# Patient Record
Sex: Female | Born: 1935 | Race: White | Hispanic: No | Smoking: Former smoker
Health system: Southern US, Community
[De-identification: ages and names within clinical notes are randomized; demographics above are authoritative.]

## PROBLEM LIST (undated history)

## (undated) DIAGNOSIS — I2589 Other forms of chronic ischemic heart disease: Secondary | ICD-10-CM

## (undated) DIAGNOSIS — N189 Chronic kidney disease, unspecified: Secondary | ICD-10-CM

## (undated) DIAGNOSIS — N2581 Secondary hyperparathyroidism of renal origin: Secondary | ICD-10-CM

## (undated) DIAGNOSIS — E039 Hypothyroidism, unspecified: Secondary | ICD-10-CM

## (undated) DIAGNOSIS — T8859XA Other complications of anesthesia, initial encounter: Secondary | ICD-10-CM

## (undated) DIAGNOSIS — R112 Nausea with vomiting, unspecified: Secondary | ICD-10-CM

## (undated) DIAGNOSIS — I2585 Chronic coronary microvascular dysfunction: Secondary | ICD-10-CM

## (undated) DIAGNOSIS — Z9889 Other specified postprocedural states: Secondary | ICD-10-CM

## (undated) HISTORY — PX: CARDIAC CATHETERIZATION: SHX172

## (undated) HISTORY — PX: ABDOMINAL HYSTERECTOMY: SHX81

## (undated) HISTORY — PX: CHOLECYSTECTOMY: SHX55

## (undated) HISTORY — PX: TONSILLECTOMY: SUR1361

---

## 1997-09-22 HISTORY — PX: EYE SURGERY: SHX253

## 2007-08-03 ENCOUNTER — Encounter: Admission: RE | Admit: 2007-08-03 | Discharge: 2007-08-03 | Payer: Self-pay | Admitting: Otolaryngology

## 2007-08-04 ENCOUNTER — Encounter (INDEPENDENT_AMBULATORY_CARE_PROVIDER_SITE_OTHER): Payer: Self-pay | Admitting: Otolaryngology

## 2007-08-04 ENCOUNTER — Ambulatory Visit (HOSPITAL_BASED_OUTPATIENT_CLINIC_OR_DEPARTMENT_OTHER): Admission: RE | Admit: 2007-08-04 | Discharge: 2007-08-05 | Payer: Self-pay | Admitting: Otolaryngology

## 2011-02-04 NOTE — Op Note (Signed)
Hannah Moody, Hannah Moody                  ACCOUNT NO.:  [id number]   MEDICAL RECORD NO.:  [id number]          PATIENT TYPE:  AMB   LOCATION:  DSC                          FACILITY:  [facility]   PHYSICIAN:  Carolan Shiver, M.D.    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  I performed a  successful stapedectomy of her right ear on January 08, 1995.  She did  well and now has 20 dB hearing in the right ear.  Her hearing in the  left ear has progressively deteriorated and when seen preop August 03, 2007, she was found to have an SRT of 55 dB AS with 96% discrimination  and a stiffness curve.  She was counseled that she could do nothing,  wear a hearing aid or proceed with a stapedectomy, left ear.  She  elected to proceed with the operative procedure.   Risks and complications of stapedectomy were explained to her questions  were invited and answered.  Informed consent was signed and witnessed.  She was very familiar with the risks and complications and she had  previously undergone a stapedectomy right ear in April 1996 by myself.   JUSTIFICATION FOR OUTPATIENT SETTING:  Patient's age and need general  tracheal anesthesia.   JUSTIFICATION FOR OVERNIGHT STAY:  23 hours of bedrest status post  stapedectomy, left ear.   PREOPERATIVE DIAGNOSIS:  1. 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  2. Status post successful stapedectomy, right ear, April 1996   POSTOPERATIVE DIAGNOSES:  1. 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  2. Status post successful stapedectomy, right ear, April 1996   OPERATION:  Stapedectomy left ear with a 4.0 mm titanium  Robinson  prosthesis with a large well narrow shaft plus the vein graft.   SURGEON:  Carolan Shiver, M.D.   ANESTHESIA:  General endotracheal, Dr. Arta Bruce.   COMPLICATIONS:  None.   SUMMARY OF REPORT:  After the patient was taken to the operating room,  she was placed in the supine position.  An IV had been begun in the  holding area in her right forearm.  General IV induction was then  performed under the guidance of Dr. Michelle Piper.  The patient was then orally  intubated.  Eyelids were taped shut.  She was properly positioned and  monitored.  Elbows, ankles padded with foam rubber and PAS stockings  were applied.  Small amount of hair was clipped in the left  postauricular area.  Hair was taped, a stocking cap was applied.  A time-  out was performed.  Her left ear and hemiface as well as her left distal  forearm were then prepped with alcohol.  The left  postauricular area was  then infiltrated with 5 mL of 1% Xylocaine with 1:100,000 epinephrine.  A small amount of the same local anesthesia was infiltrated over distal  left forearm vein.  The patient's left ear and hemiface and left distal  forearm was then prepped with Betadine and the forearm was draped in a  standard fashion for a vein graft harvesting.   Vein graft:  A 1 cm incision was made perpendicular to a distal left  forearm vein after tourniquet had been applied.  A 1 cm segment of vein  graft was harvested.  The vein was ligated with a 3-0 silk stick ties  and skin was closed with running locking 5-0 Novofil.  A bacitracin Band-  Aid was applied and the arm was padded with foam rubber and tucked by  her left side.  Gowns and gloves were changed.  Her left ear which had  been prepped, was then draped in standard fashion for stapedectomy   Examination of the left ear canal revealed a 5-mm diameter canal.  The  canal was then dilated eventually to 6 mm.  The canal was cleaned and  debrided and irrigated with saline.  Four  quadrant ear canal blocks were  performed with 1 mL of 2% Xylocaine with 1:50,000 epinephrine including  a vascular strip block,   The vein graft which had been previously harvested was then removed from  saline, opened with Joseph's scissors, pressed in a vein press and dried  for later use.   An atticotomy flap was then elevated with McCabe and [employer] and  the middle ear was entered without difficulty.  The chorda tympani nerve  was dissected free and preserved.  Examination of the ossicles revealed  a mobile malleus and incus and a very thick stapes.  The posterior  superior bony canal wall was then curetted with a stapes curette to  expose the pyramidal eminence.  The foot plate was blue.  I expected  that she might have far advanced otosclerosis which she did not have.  The facial nerve was examined and the facial nerve was exposed in a  horizontal portion just superior to the stapes superstructure.  Mucosa  surrounding the oval window and just in the posterior portion of the  horizontal facial canal just before the second genu.  The mucosa was  abraded with a straight Koss pick.  Measurement was taken from the  footplate to the medial surface of the long process of the incus.  This  measured 4 mm, standard measurement.  The incudostapedial joint was then  separated with a tab knife and the stapedial tendon was cut with McGee  scissors.   The vein graft which had been previously dried was then trimmed and  ready for insertion.  A 4.0 mm titanium Robinson stapedectomy prosthesis  with a large well and narrow shaft was then opened and measured to  confirm its 4.0 mm length which was found to be correct.   A control hole was then placed in the center of the footplate with a  straight Koss pick and the stapes superstructure was down fractured. The  capitulum and posterior crus came out in one segment and was sent to  pathology.  The anterior crus was removed in a separate  maneuver.   Using a 1/3 mm 45 degrees Koss foot plate hook, the small control hole  was then enlarged and then the posterior one half of the footplate was  removed gently with a  Farrior rasp.  No suctioning was performed in the  oval window.  The vein graft which were previously dried was then placed  from the promontory side to cover the entire oval window portion of the  graft, was draped up superiorly over the exposed facial nerve and the  abraded facial canal.  The remainder of the vein graft was placed over  the promontory.  A small dimple was placed in the vein graft using a  measuring rod.   A 4.0 mm titanium Robinson prosthesis with a large well narrow shaft was  then lowered into the ear with the shaft in the dimple in the vein graft  and the well of the prosthesis adjacent to the lenticular process.  Using a two-handed technique the incus was gently retracted laterally  and the well of the prosthesis was guided onto the lenticular process of  the incus using a strut guide.  This was done in one smooth motion.  The  wire keeper was then rotated for 100 degrees onto the long process of  the incus.  No crimping was performed.  Gentle palpation of the incus  yielded a positive round window reflex.  A disk of Gelfoam soaked in  saline was then packed around the vein graft to hold this in place and  stabilized it.  All cotton balls removed from the ear canal.  The cotton  ball was count was deemed to be correct.  The atticotomy flap was then  draped up the posterior bony canal wall in the anatomic position.  There  were no perforations.  Medial canal was then packed Gelfoam disks soaked  in Cipro HC drops.  Bacitracin cotton ball was placed in the ear canal.  The patient was awakened, extubated deeply and transferred to her  hospital bed.  She appeared to tolerate both the general endotracheal  anesthesia and the procedures well and left the operating room in stable  condition.    TOTAL FLUIDS:  1000 mL.   ESTIMATED BLOOD LOSS:  Less than 5 mL.   The patient received Ancef 1 grams IV, Zofran 4 mg IV at the beginning  of the procedure and Decadron 10 mg IV.  Partial left stapes  superstructure was sent as a specimen.   Hannah Moody will be admitted to the 23-hour recovery care stay unit for  IV hydration, observation of facial function and at bedrest for 23  hours.  If stable overnight, she will be discharged on August 05, 2007  with her family, who will be instructed to return her to my office on  08/11/07 1:20 p.m. Discharge medications will include Cefzil 500 mg p.o.  b.i.d. times 10 days with food, Cipro HC drops 2 drops AS t.i.d. times 7  days, Percocet 5/325 number 20 tablets one to two p.o. q.6 h p.r.n. pain  and Phenergan suppositories 12.5 mg #2 one PR q.6 h p.r.n. nausea.  She  is to follow a regular diet, keep her head elevated, avoid aspirin or  aspirin products, avoid heavy lifting or straining greater than 20  pounds times 1 week and avoid water exposure AS for the next 3 weeks.  She is not to drive for 1 week and then not until she can turn her head  from side to side without any disequilibrium.  She is to call [phone]  for any postoperative problems directly related to the procedure.  She  will be given both verbal and written instructions.   SUMMARY OF FINDINGS:  The patient was found to have classic otosclerosis  with a blue footplate and a fixed stapes.  She had a mobile malleus and  incus.  The posterior one half of the footplate was removed after  control hole was placed in the center of the footplate.  A 4.0 mm  titanium Robinson stapedectomy prosthesis with a large well narrow shaft  plus the vein graft was used, chorda tympani nerve was dissected free  and preserved and not stretched.  Facial nerve was found to be exposed  at horizontal portion, was not disturbed during the procedure.  There  was a positive round window reflex at  termination of the procedure.  There were no perforations, middle ear mucosa was healthy, eustachian  tube patency was not tested.           ______________________________  Carolan Shiver, M.D.     EMK/MEDQ  D:  08/04/2007  T:  08/05/2007  Job:  [id number]

## 2011-07-01 LAB — BASIC METABOLIC PANEL
BUN: 22
Calcium: 9.3
Chloride: 102
Creatinine, Ser: 1.1
GFR calc Af Amer: 59 — ABNORMAL LOW
Potassium: 3.7
Sodium: 137

## 2011-07-01 LAB — DIFFERENTIAL
Basophils Relative: 0
Eosinophils Absolute: 0.2
Eosinophils Relative: 4
Lymphs Abs: 1.3

## 2011-07-01 LAB — CBC
HCT: 33.2 — ABNORMAL LOW
Hemoglobin: 11.2 — ABNORMAL LOW
MCHC: 33.8
MCV: 87.9

## 2011-07-01 LAB — PROTIME-INR: Prothrombin Time: 12.2

## 2011-07-01 LAB — APTT: aPTT: 28

## 2020-04-24 ENCOUNTER — Other Ambulatory Visit: Payer: Self-pay | Admitting: Neurosurgery

## 2020-05-07 ENCOUNTER — Other Ambulatory Visit: Payer: Self-pay | Admitting: Neurosurgery

## 2020-05-14 NOTE — Progress Notes (Addendum)
[facility] - [place], [place] - [address] [place] [address] Phone: [phone] Fax: [phone]      Your procedure is scheduled on August 25  Report to [facility] Main Entrance "A" at 0815 A.M., and check in at the Admitting office.  Call this number if you have problems the morning of surgery:  [phone]  Call [phone] if you have any questions prior to your surgery date Monday-Friday 8am-4pm    Remember:  Do not eat or drink after midnight the night before your surgery     Take these medicines the morning of surgery with A SIP OF WATER  amLODipine (NORVASC) gabapentin (NEURONTIN) HYDROcodone-acetaminophen (NORCO/VICODIN) if needed levothyroxine (SYNTHROID  Follow your surgeon's instructions on when to stop Aspirin.  If no instructions were given by your surgeon then you will need to call the office to get those instructions.    As of today, STOP taking any Aspirin (unless otherwise instructed by your surgeon) Aleve, Naproxen, Ibuprofen, Motrin, Advil, Goody's, BC's, all herbal medications, fish oil, and all vitamins.                      Do not wear jewelry, make up, or nail polish            Do not wear lotions, powders, perfumes/colognes, or deodorant.            Do not shave 48 hours prior to surgery.             Do not bring valuables to the hospital.            [facility] is not responsible for any belongings or valuables.  Do NOT Smoke (Tobacco/Vaping) or drink Alcohol 24 hours prior to your procedure If you use a CPAP at night, you may bring all equipment for your overnight stay.   Contacts, glasses, dentures or bridgework may not be worn into surgery.      For patients admitted to the hospital, discharge time will be determined by your treatment team.   Patients discharged the day of surgery will not be allowed to drive home, and someone needs to stay with them for 24 hours.    Special instructions:   [facility]- Preparing For Surgery  Before surgery, you can play an important role. Because skin is not sterile, your skin needs to be as free of germs as possible. You can reduce the number of germs on your skin by washing with CHG (chlorahexidine gluconate) Soap before surgery.  CHG is an antiseptic cleaner which kills germs and bonds with the skin to continue killing germs even after washing.    Oral Hygiene is also important to reduce your risk of infection.  Remember - BRUSH YOUR TEETH THE MORNING OF SURGERY WITH YOUR REGULAR TOOTHPASTE  Please do not use if you have an allergy to CHG or antibacterial soaps. If your skin becomes reddened/irritated stop using the CHG.  Do not shave (including legs and underarms) for at least 48 hours prior to first CHG shower. It is OK to shave your face.  Please follow these instructions carefully.   1. Shower the NIGHT BEFORE SURGERY and the MORNING OF SURGERY with CHG Soap.   2. If you chose to wash your hair, wash your hair first as usual with your normal shampoo.  3. After you shampoo, rinse your hair and body thoroughly to remove the shampoo.  4. Use CHG as  you would any other liquid soap. You can apply CHG directly to the skin and wash gently with a scrungie or a clean washcloth.   5. Apply the CHG Soap to your body ONLY FROM THE NECK DOWN.  Do not use on open wounds or open sores. Avoid contact with your eyes, ears, mouth and genitals (private parts). Wash Face and genitals (private parts)  with your normal soap.   6. Wash thoroughly, paying special attention to the area where your surgery will be performed.  7. Thoroughly rinse your body with warm water from the neck down.  8. DO NOT shower/wash with your normal soap after using and rinsing off the CHG Soap.  9. Pat yourself dry with a CLEAN TOWEL.  10. Wear CLEAN PAJAMAS to bed the night before surgery  11. Place CLEAN SHEETS on your bed the night of your first shower and DO NOT SLEEP WITH  PETS.   Day of Surgery: Wear Clean/Comfortable clothing the morning of surgery Do not apply any deodorants/lotions.   Remember to brush your teeth WITH YOUR REGULAR TOOTHPASTE.   Please read over the following fact sheets that you were given.

## 2020-05-15 ENCOUNTER — Other Ambulatory Visit: Payer: Self-pay

## 2020-05-15 ENCOUNTER — Encounter (HOSPITAL_COMMUNITY): Payer: Self-pay

## 2020-05-15 ENCOUNTER — Other Ambulatory Visit (HOSPITAL_COMMUNITY): Payer: Self-pay

## 2020-05-15 ENCOUNTER — Encounter (HOSPITAL_COMMUNITY)
Admission: RE | Admit: 2020-05-15 | Discharge: 2020-05-15 | Disposition: A | Discharge status: Home / Self Care | Payer: Medicare PPO | Source: Ambulatory Visit | Attending: Neurosurgery | Admitting: Neurosurgery

## 2020-05-15 DIAGNOSIS — N189 Chronic kidney disease, unspecified: Secondary | ICD-10-CM | POA: Diagnosis not present

## 2020-05-15 DIAGNOSIS — Z87891 Personal history of nicotine dependence: Secondary | ICD-10-CM | POA: Diagnosis not present

## 2020-05-15 DIAGNOSIS — E039 Hypothyroidism, unspecified: Secondary | ICD-10-CM | POA: Diagnosis not present

## 2020-05-15 DIAGNOSIS — M7138 Other bursal cyst, other site: Secondary | ICD-10-CM | POA: Insufficient documentation

## 2020-05-15 DIAGNOSIS — Z01818 Encounter for other preprocedural examination: Secondary | ICD-10-CM | POA: Diagnosis present

## 2020-05-15 DIAGNOSIS — Z7982 Long term (current) use of aspirin: Secondary | ICD-10-CM | POA: Insufficient documentation

## 2020-05-15 DIAGNOSIS — Z79899 Other long term (current) drug therapy: Secondary | ICD-10-CM | POA: Diagnosis not present

## 2020-05-15 DIAGNOSIS — Z7901 Long term (current) use of anticoagulants: Secondary | ICD-10-CM | POA: Diagnosis not present

## 2020-05-15 HISTORY — DX: Secondary hyperparathyroidism of renal origin: N25.81

## 2020-05-15 HISTORY — DX: Hypothyroidism, unspecified: E03.9

## 2020-05-15 HISTORY — DX: Chronic kidney disease, unspecified: N18.9

## 2020-05-15 HISTORY — DX: Other specified postprocedural states: Z98.890

## 2020-05-15 HISTORY — DX: Other specified postprocedural states: R11.2

## 2020-05-15 HISTORY — DX: Other forms of chronic ischemic heart disease: I25.89

## 2020-05-15 HISTORY — DX: Chronic coronary microvascular dysfunction: I25.85

## 2020-05-15 HISTORY — DX: Other complications of anesthesia, initial encounter: T88.59XA

## 2020-05-15 LAB — CBC
HCT: 32.7 % — ABNORMAL LOW (ref 36.0–46.0)
Hemoglobin: 10.4 g/dL — ABNORMAL LOW (ref 12.0–15.0)
MCH: 29.3 pg (ref 26.0–34.0)
MCHC: 31.8 g/dL (ref 30.0–36.0)
MCV: 92.1 fL (ref 80.0–100.0)
Platelets: 283 10*3/uL (ref 150–400)
RBC: 3.55 MIL/uL — ABNORMAL LOW (ref 3.87–5.11)
RDW: 12.8 % (ref 11.5–15.5)
WBC: 7 10*3/uL (ref 4.0–10.5)
nRBC: 0 % (ref 0.0–0.2)

## 2020-05-15 LAB — BASIC METABOLIC PANEL
Anion gap: 9 (ref 5–15)
BUN: 26 mg/dL — ABNORMAL HIGH (ref 8–23)
CO2: 25 mmol/L (ref 22–32)
Calcium: 9.3 mg/dL (ref 8.9–10.3)
Chloride: 102 mmol/L (ref 98–111)
Creatinine, Ser: 1.45 mg/dL — ABNORMAL HIGH (ref 0.44–1.00)
GFR calc Af Amer: 37 mL/min — ABNORMAL LOW (ref >60)
GFR calc non Af Amer: 32 mL/min — ABNORMAL LOW (ref >60)
Glucose, Bld: 105 mg/dL — ABNORMAL HIGH (ref 70–99)
Potassium: 4.3 mmol/L (ref 3.5–5.1)
Sodium: 136 mmol/L (ref 135–145)

## 2020-05-15 LAB — SURGICAL PCR SCREEN
MRSA, PCR: NEGATIVE
Staphylococcus aureus: NEGATIVE

## 2020-05-15 NOTE — Progress Notes (Signed)
Anesthesia PAT Evaluation:  Case: [id number] Date/Time: 05/16/20 1002   Procedure: LAMINECTOMY FOR FACET/SYNOVIAL CYST RIGHT LUMBAR 5- SACRAL 1 (Right )   Anesthesia type: General   Pre-op diagnosis: SYNOVIAL CYST OF LUMBAR FACET JOINT   Location: [facility] OR ROOM 19 / [facility] OR   Surgeons: Tressie Stalker, MD      DISCUSSION: Patient is an 86 year old female scheduled for the above procedure. She lives in [place], [place] and would have to travel for surgery. Her son and daughter-in-law recommended Dr. Lovell Sheehan from when they lived in this area.   She and her daughter-in-law Mariane Duval are staying at a hotel the night before surgery. Jinny's cell phone number is [phone]. Patient's son Robin's cell number is [phone].  History includes former smoker (quit 09/22/74), post-operative N/V, CKD, secondary hyperparathyroidism of renal origin, microvascular cardiac disease (per PCP notes), hypothyroidism, tonsillectomy, cholecystectomy, hysterectomy.   I saw patient due to reported history of chronic sharp exertional chest pain (since 1980's) when walking up an incline. Pain relieved when she stops, so tends to just be mindful of what actitivities she does. Reports multiple evaluations including a cardiac cath in the 1980's in [place] that showed "a healthy heart without blockages". These pains have not progressed since the 80's. She believes her last stress test was ~ 5 years ago (at [facility] which is now [facility]; PCP office said last record for a stress test was from 2007, but records are not readily available other than EF 81% then.)  She developed back and RLE pain starting around 10/2019 and worsening by late April. She had been able to walk a mile in 15 minutes up to then, but had to stop walking due to RLE pain. She denied SOB at rest, palpitations, syncope, edema. No significant dyspnea except when activity such as carrying heavy items. She says her PCP monitors her kidney function, but she does not  see a nephrologist. She is unsure of what her Creatinine runs but says she was just evaluated by her PCP on 05/02/20.   I called and spoke with staff at her PCP Dr. Blima Dessert office. Patient was seen for preoperative evaluation on 05/02/20. He notes CKD stage 3b with Cr 1.74 with secondary hyperparathyroidism (Ca 10.7). He also notes that patient has a chronic history of exertional chest pains for > 15 years during which is "stable". He added, "her coronary disease is clearly microvascular.  She has had an extensive work-up for significant large vessel coronary disease which has been negative..I see no medical contraindication to her proceeding with this back surgery."  She left her 05/15/20 PAT visit with little time to make it to the COVID-19 testing site which is 20 minutes from the hospital. She was informed that if she did not make it before closing then we would get a COVID-19 test on the day of surgery. I did review history and medical clearance with anesthesiologist Adonis Huguenin, MD.    VS: BP (!) 161/71   Pulse 78   Temp 36.4 C (Oral)   Resp 18   Ht 5\' 7"  (1.702 m)   Wt 52.8 kg   SpO2 100%   BMI 18.22 kg/m  Heart RRR, no murmur noted. Lungs clear. No carotid bruits noted. No ankle edema.    PROVIDERS: Dr. with [facility] is PCP    LABS: Labs reviewed: Acceptable for surgery. Cr stable 1.45, down from 1.74 on 05/02/20. H/H 10.4/32.7, previously 11.2/33.3 on 05/02/20. (all labs ordered  are listed, but only abnormal results are displayed)  Labs Reviewed  BASIC METABOLIC PANEL - Abnormal; Notable for the following components:      Result Value   Glucose, Bld 105 (*)    BUN 26 (*)    Creatinine, Ser 1.45 (*)    GFR calc non Af Amer 32 (*)    GFR calc Af Amer 37 (*)    All other components within normal limits  CBC - Abnormal; Notable for the following components:   RBC 3.55 (*)    Hemoglobin 10.4 (*)    HCT 32.7 (*)    All other components  within normal limits  SURGICAL PCR SCREEN    EKG: 05/15/20: NSR   CV: See DISCUSSION. History of exertional chest pain since the 1980's with "negative" work-ups and symptoms attributed to microvascular CAD. Reported a cardiac cath in the 1980's.  By Dr. Blima Dessert notes, "02-18-06 Nuclear Cardiac Stress/Ejection Fraction 81 %".   Past Medical History:  Diagnosis Date  . Cardiac microvascular disease   . Chronic kidney disease   . Complication of anesthesia    Vomited for three days after hysterectomy  . Hypothyroidism   . PONV (postoperative nausea and vomiting)   . Secondary hyperparathyroidism of renal origin [facility])     Past Surgical History:  Procedure Laterality Date  . ABDOMINAL HYSTERECTOMY    . CARDIAC CATHETERIZATION     Reportedly in 1980's in [place] with no blockages  . CHOLECYSTECTOMY    . EYE SURGERY Right 1999   macular hole repair  . TONSILLECTOMY      MEDICATIONS: . amLODipine (NORVASC) 2.5 MG tablet  . aspirin EC 81 MG tablet  . Biotin 1000 MCG tablet  . calcitRIOL (ROCALTROL) 0.25 MCG capsule  . Cholecalciferol (VITAMIN D) 50 MCG (2000 UT) tablet  . gabapentin (NEURONTIN) 300 MG capsule  . HYDROcodone-acetaminophen (NORCO/VICODIN) 5-325 MG tablet  . hydrOXYzine (ATARAX/VISTARIL) 25 MG tablet  . levothyroxine (SYNTHROID) 75 MCG tablet  . vitamin B-12 (CYANOCOBALAMIN) 1000 MCG tablet   No current facility-administered medications for this encounter.  Last dose ASA 05/09/20.    Shonna Chock, PA-C Surgical Short Stay/Anesthesiology [facility] Phone [phone] [facility] Phone [phone] 05/15/2020 5:55 PM

## 2020-05-15 NOTE — Progress Notes (Signed)
PCP - Dr. Rozelle Logan with [facility] Cardiologist - Denies  Chest x-ray - Not indicated EKG - 05/15/20 Stress Test - Yes several cannot recall year ECHO - Denies Cardiac Cath - Yes in the 80's in [place].  Had som SOB when walking at fast pace or up hill.  Hurts but goes away fast.  Per patient cath revealed no blockage.  Patient still experiences the chest pain when walking at times.   Sleep Study - No OSA DM - Denies  Aspirin Instructions: Last dose 05/09/20  COVID TEST-  05/15/20  Anesthesia review: Yes cardiac history  Patient denies shortness of breath, fever, cough and chest pain at PAT appointment   All instructions explained to the patient, with a verbal understanding of the material. Patient agrees to go over the instructions while at home for a better understanding. Patient also instructed to self quarantine after being tested for COVID-19. The opportunity to ask questions was provided.   Requesting records from PCP

## 2020-05-15 NOTE — Anesthesia Preprocedure Evaluation (Addendum)
Anesthesia Evaluation  Patient identified by MRN, date of birth, ID band Patient awake    Reviewed: Allergy & Precautions, NPO status , Patient's Chart, lab work & pertinent test results  History of Anesthesia Complications (+) PONV  Airway Mallampati: II  TM Distance: >3 FB Neck ROM: Full    Dental  (+) Dental Advisory Given   Pulmonary former smoker,    breath sounds clear to auscultation       Cardiovascular negative cardio ROS   Rhythm:Regular Rate:Normal     Neuro/Psych negative neurological ROS     GI/Hepatic negative GI ROS, Neg liver ROS,   Endo/Other  Hypothyroidism   Renal/GU Renal disease     Musculoskeletal   Abdominal   Peds  Hematology negative hematology ROS (+)   Anesthesia Other Findings   Reproductive/Obstetrics                            Anesthesia Physical Anesthesia Plan  ASA: III  Anesthesia Plan: General   Post-op Pain Management:    Induction: Intravenous  PONV Risk Score and Plan: 4 or greater and Dexamethasone and Treatment may vary due to age or medical condition  Airway Management Planned: Oral ETT  Additional Equipment: None  Intra-op Plan:   Post-operative Plan: Extubation in OR  Informed Consent: I have reviewed the patients History and Physical, chart, labs and discussed the procedure including the risks, benefits and alternatives for the proposed anesthesia with the patient or authorized representative who has indicated his/her understanding and acceptance.     Dental advisory given  Plan Discussed with: CRNA  Anesthesia Plan Comments: ( )       Anesthesia Quick Evaluation

## 2020-05-16 ENCOUNTER — Encounter (HOSPITAL_COMMUNITY): Payer: Self-pay | Admitting: Neurosurgery

## 2020-05-16 ENCOUNTER — Ambulatory Visit (HOSPITAL_COMMUNITY): Payer: Medicare PPO

## 2020-05-16 ENCOUNTER — Ambulatory Visit (HOSPITAL_COMMUNITY): Payer: Medicare PPO | Admitting: Anesthesiology

## 2020-05-16 ENCOUNTER — Encounter (HOSPITAL_COMMUNITY)
Admission: RE | Disposition: A | Discharge status: Home / Self Care | Payer: Self-pay | Source: Home / Self Care | Attending: Neurosurgery

## 2020-05-16 ENCOUNTER — Ambulatory Visit (HOSPITAL_COMMUNITY): Payer: Medicare PPO | Admitting: Vascular Surgery

## 2020-05-16 ENCOUNTER — Ambulatory Visit (HOSPITAL_COMMUNITY)
Admission: RE | Admit: 2020-05-16 | Discharge: 2020-05-17 | Disposition: A | Discharge status: Home / Self Care | Payer: Medicare PPO | Attending: Neurosurgery | Admitting: Neurosurgery

## 2020-05-16 DIAGNOSIS — Z7982 Long term (current) use of aspirin: Secondary | ICD-10-CM | POA: Diagnosis not present

## 2020-05-16 DIAGNOSIS — Z79890 Hormone replacement therapy (postmenopausal): Secondary | ICD-10-CM | POA: Diagnosis not present

## 2020-05-16 DIAGNOSIS — Z79899 Other long term (current) drug therapy: Secondary | ICD-10-CM | POA: Diagnosis not present

## 2020-05-16 DIAGNOSIS — Z87891 Personal history of nicotine dependence: Secondary | ICD-10-CM | POA: Diagnosis not present

## 2020-05-16 DIAGNOSIS — M7138 Other bursal cyst, other site: Secondary | ICD-10-CM | POA: Diagnosis not present

## 2020-05-16 DIAGNOSIS — M5417 Radiculopathy, lumbosacral region: Secondary | ICD-10-CM | POA: Diagnosis not present

## 2020-05-16 DIAGNOSIS — Z20822 Contact with and (suspected) exposure to covid-19: Secondary | ICD-10-CM | POA: Insufficient documentation

## 2020-05-16 DIAGNOSIS — E039 Hypothyroidism, unspecified: Secondary | ICD-10-CM | POA: Insufficient documentation

## 2020-05-16 DIAGNOSIS — M4807 Spinal stenosis, lumbosacral region: Secondary | ICD-10-CM | POA: Insufficient documentation

## 2020-05-16 DIAGNOSIS — N189 Chronic kidney disease, unspecified: Secondary | ICD-10-CM | POA: Diagnosis not present

## 2020-05-16 DIAGNOSIS — Z419 Encounter for procedure for purposes other than remedying health state, unspecified: Secondary | ICD-10-CM

## 2020-05-16 HISTORY — PX: DECOMPRESSIVE LUMBAR LAMINECTOMY LEVEL 1: SHX5791

## 2020-05-16 LAB — SARS CORONAVIRUS 2 BY RT PCR (HOSPITAL ORDER, PERFORMED IN ~~LOC~~ HOSPITAL LAB): SARS Coronavirus 2: NEGATIVE

## 2020-05-16 SURGERY — DECOMPRESSIVE LUMBAR LAMINECTOMY LEVEL 1
Anesthesia: General | Laterality: Right

## 2020-05-16 MED ORDER — LACTATED RINGERS IV SOLN: INTRAVENOUS | Status: DC | PRN

## 2020-05-16 MED ORDER — ONDANSETRON HCL 4 MG/2ML IJ SOLN
INTRAMUSCULAR | Status: AC
  Filled 2020-05-16: qty 2

## 2020-05-16 MED ORDER — ACETAMINOPHEN 650 MG RE SUPP: 650.0000 mg | RECTAL | Status: DC | PRN

## 2020-05-16 MED ORDER — ACETAMINOPHEN 325 MG PO TABS: 650.0000 mg | ORAL_TABLET | ORAL | Status: DC | PRN

## 2020-05-16 MED ORDER — CHLORHEXIDINE GLUCONATE 0.12 % MT SOLN
15.0000 mL | Freq: Once | OROMUCOSAL | Status: AC
  Administered 2020-05-16: 15 mL via OROMUCOSAL
  Filled 2020-05-16: qty 15

## 2020-05-16 MED ORDER — SODIUM CHLORIDE 0.9% FLUSH
3.0000 mL | Freq: Two times a day (BID) | INTRAVENOUS | Status: DC
  Administered 2020-05-16: 3 mL via INTRAVENOUS

## 2020-05-16 MED ORDER — BACITRACIN ZINC 500 UNIT/GM EX OINT
TOPICAL_OINTMENT | CUTANEOUS | Status: AC
  Filled 2020-05-16: qty 28.35

## 2020-05-16 MED ORDER — THROMBIN 5000 UNITS EX SOLR
OROMUCOSAL | Status: DC | PRN
  Administered 2020-05-16: 5 mL via TOPICAL

## 2020-05-16 MED ORDER — BACITRACIN ZINC 500 UNIT/GM EX OINT
TOPICAL_OINTMENT | CUTANEOUS | Status: DC | PRN
  Administered 2020-05-16: 1 via TOPICAL

## 2020-05-16 MED ORDER — OXYCODONE HCL 5 MG PO TABS: 10.0000 mg | ORAL_TABLET | ORAL | Status: DC | PRN

## 2020-05-16 MED ORDER — GABAPENTIN 300 MG PO CAPS
300.0000 mg | ORAL_CAPSULE | Freq: Two times a day (BID) | ORAL | Status: DC
  Administered 2020-05-16: 300 mg via ORAL
  Filled 2020-05-16: qty 1

## 2020-05-16 MED ORDER — CEFAZOLIN SODIUM-DEXTROSE 2-4 GM/100ML-% IV SOLN
2.0000 g | Freq: Two times a day (BID) | INTRAVENOUS | Status: DC
  Administered 2020-05-16: 2 g via INTRAVENOUS
  Filled 2020-05-16: qty 100

## 2020-05-16 MED ORDER — THROMBIN 5000 UNITS EX SOLR
CUTANEOUS | Status: AC
  Filled 2020-05-16: qty 10000

## 2020-05-16 MED ORDER — DOCUSATE SODIUM 100 MG PO CAPS
100.0000 mg | ORAL_CAPSULE | Freq: Two times a day (BID) | ORAL | Status: DC
  Administered 2020-05-16: 100 mg via ORAL
  Filled 2020-05-16: qty 1

## 2020-05-16 MED ORDER — 0.9 % SODIUM CHLORIDE (POUR BTL) OPTIME
TOPICAL | Status: DC | PRN
  Administered 2020-05-16: 1000 mL

## 2020-05-16 MED ORDER — BISACODYL 10 MG RE SUPP: 10.0000 mg | Freq: Every day | RECTAL | Status: DC | PRN

## 2020-05-16 MED ORDER — PROPOFOL 10 MG/ML IV BOLUS
INTRAVENOUS | Status: AC
  Filled 2020-05-16: qty 20

## 2020-05-16 MED ORDER — FENTANYL CITRATE (PF) 100 MCG/2ML IJ SOLN
INTRAMUSCULAR | Status: DC | PRN
Start: 2020-05-16 — End: 2020-05-16
  Administered 2020-05-16 (×4): 50 ug via INTRAVENOUS

## 2020-05-16 MED ORDER — ONDANSETRON HCL 4 MG/2ML IJ SOLN
INTRAMUSCULAR | Status: DC | PRN
  Administered 2020-05-16: 4 mg via INTRAVENOUS

## 2020-05-16 MED ORDER — LIDOCAINE 2% (20 MG/ML) 5 ML SYRINGE
INTRAMUSCULAR | Status: DC | PRN
  Administered 2020-05-16: 30 mg via INTRAVENOUS

## 2020-05-16 MED ORDER — DEXAMETHASONE SODIUM PHOSPHATE 10 MG/ML IJ SOLN
INTRAMUSCULAR | Status: AC
  Filled 2020-05-16: qty 1

## 2020-05-16 MED ORDER — BUPIVACAINE-EPINEPHRINE (PF) 0.5% -1:200000 IJ SOLN
INTRAMUSCULAR | Status: DC | PRN
  Administered 2020-05-16 (×2): 10 mL via PERINEURAL

## 2020-05-16 MED ORDER — HYDROXYZINE HCL 25 MG PO TABS: 25.0000 mg | ORAL_TABLET | Freq: Two times a day (BID) | ORAL | Status: DC | PRN

## 2020-05-16 MED ORDER — DIAZEPAM 5 MG PO TABS: 5.0000 mg | ORAL_TABLET | Freq: Once | ORAL | Status: DC

## 2020-05-16 MED ORDER — PROPOFOL 10 MG/ML IV BOLUS
INTRAVENOUS | Status: DC | PRN
  Administered 2020-05-16: 100 mg via INTRAVENOUS

## 2020-05-16 MED ORDER — ORAL CARE MOUTH RINSE: 15.0000 mL | Freq: Once | OROMUCOSAL | Status: AC

## 2020-05-16 MED ORDER — CHLORHEXIDINE GLUCONATE CLOTH 2 % EX PADS: 6.0000 | MEDICATED_PAD | Freq: Once | CUTANEOUS | Status: DC

## 2020-05-16 MED ORDER — OXYCODONE HCL 5 MG PO TABS
ORAL_TABLET | ORAL | Status: AC
Start: 2020-05-16 — End: 2020-05-17
  Filled 2020-05-16: qty 1

## 2020-05-16 MED ORDER — DEXAMETHASONE SODIUM PHOSPHATE 10 MG/ML IJ SOLN
INTRAMUSCULAR | Status: DC | PRN
  Administered 2020-05-16: 10 mg via INTRAVENOUS

## 2020-05-16 MED ORDER — SODIUM CHLORIDE 0.9 % IV SOLN: 250.0000 mL | INTRAVENOUS | Status: DC

## 2020-05-16 MED ORDER — MENTHOL 3 MG MT LOZG: 1.0000 | LOZENGE | OROMUCOSAL | Status: DC | PRN

## 2020-05-16 MED ORDER — SUGAMMADEX SODIUM 200 MG/2ML IV SOLN
INTRAVENOUS | Status: DC | PRN
  Administered 2020-05-16: 200 mg via INTRAVENOUS

## 2020-05-16 MED ORDER — SODIUM CHLORIDE 0.9% FLUSH: 3.0000 mL | INTRAVENOUS | Status: DC | PRN

## 2020-05-16 MED ORDER — HYDROCODONE-ACETAMINOPHEN 5-325 MG PO TABS: 1.0000 | ORAL_TABLET | ORAL | Status: DC | PRN

## 2020-05-16 MED ORDER — FENTANYL CITRATE (PF) 250 MCG/5ML IJ SOLN
INTRAMUSCULAR | Status: AC
  Filled 2020-05-16: qty 5

## 2020-05-16 MED ORDER — LIDOCAINE 2% (20 MG/ML) 5 ML SYRINGE
INTRAMUSCULAR | Status: AC
  Filled 2020-05-16: qty 5

## 2020-05-16 MED ORDER — ROCURONIUM BROMIDE 10 MG/ML (PF) SYRINGE
PREFILLED_SYRINGE | INTRAVENOUS | Status: AC
  Filled 2020-05-16: qty 10

## 2020-05-16 MED ORDER — ONDANSETRON HCL 4 MG/2ML IJ SOLN: 4.0000 mg | Freq: Four times a day (QID) | INTRAMUSCULAR | Status: DC | PRN

## 2020-05-16 MED ORDER — PHENYLEPHRINE HCL-NACL 10-0.9 MG/250ML-% IV SOLN
INTRAVENOUS | Status: DC | PRN
  Administered 2020-05-16: 20 ug/min via INTRAVENOUS

## 2020-05-16 MED ORDER — AMLODIPINE BESYLATE 5 MG PO TABS
2.5000 mg | ORAL_TABLET | Freq: Every day | ORAL | Status: DC
  Filled 2020-05-16: qty 1

## 2020-05-16 MED ORDER — ACETAMINOPHEN 500 MG PO TABS
1000.0000 mg | ORAL_TABLET | Freq: Four times a day (QID) | ORAL | Status: DC
  Administered 2020-05-16 – 2020-05-17 (×3): 1000 mg via ORAL
  Filled 2020-05-16 (×3): qty 2

## 2020-05-16 MED ORDER — OXYCODONE-ACETAMINOPHEN 5-325 MG PO TABS: 1.0000 | ORAL_TABLET | ORAL | Status: DC | PRN

## 2020-05-16 MED ORDER — ZOLPIDEM TARTRATE 5 MG PO TABS: 5.0000 mg | ORAL_TABLET | Freq: Every evening | ORAL | Status: DC | PRN

## 2020-05-16 MED ORDER — CYCLOBENZAPRINE HCL 10 MG PO TABS
ORAL_TABLET | ORAL | Status: AC
  Administered 2020-05-16: 10 mg via ORAL
  Filled 2020-05-16: qty 1

## 2020-05-16 MED ORDER — ONDANSETRON HCL 4 MG PO TABS: 4.0000 mg | ORAL_TABLET | Freq: Four times a day (QID) | ORAL | Status: DC | PRN

## 2020-05-16 MED ORDER — ROCURONIUM BROMIDE 10 MG/ML (PF) SYRINGE
PREFILLED_SYRINGE | INTRAVENOUS | Status: DC | PRN
  Administered 2020-05-16: 40 mg via INTRAVENOUS

## 2020-05-16 MED ORDER — LACTATED RINGERS IV SOLN: INTRAVENOUS | Status: DC

## 2020-05-16 MED ORDER — BUPIVACAINE-EPINEPHRINE 0.5% -1:200000 IJ SOLN
INTRAMUSCULAR | Status: AC
  Filled 2020-05-16: qty 1

## 2020-05-16 MED ORDER — LEVOTHYROXINE SODIUM 75 MCG PO TABS
75.0000 ug | ORAL_TABLET | Freq: Every day | ORAL | Status: DC
  Administered 2020-05-17: 75 ug via ORAL
  Filled 2020-05-16: qty 1

## 2020-05-16 MED ORDER — CEFAZOLIN SODIUM-DEXTROSE 2-4 GM/100ML-% IV SOLN
INTRAVENOUS | Status: AC
  Filled 2020-05-16: qty 100

## 2020-05-16 MED ORDER — MORPHINE SULFATE (PF) 4 MG/ML IV SOLN: 4.0000 mg | INTRAVENOUS | Status: DC | PRN

## 2020-05-16 MED ORDER — SODIUM CHLORIDE 0.9 % IV SOLN
INTRAVENOUS | Status: DC | PRN
  Administered 2020-05-16: 500 mL

## 2020-05-16 MED ORDER — BUPIVACAINE LIPOSOME 1.3 % IJ SUSP
20.0000 mL | Freq: Once | INTRAMUSCULAR | Status: DC
  Filled 2020-05-16: qty 20

## 2020-05-16 MED ORDER — PHENOL 1.4 % MT LIQD: 1.0000 | OROMUCOSAL | Status: DC | PRN

## 2020-05-16 MED ORDER — OXYCODONE HCL 5 MG PO TABS: 5.0000 mg | ORAL_TABLET | ORAL | Status: DC | PRN

## 2020-05-16 MED ORDER — CYCLOBENZAPRINE HCL 10 MG PO TABS: 10.0000 mg | ORAL_TABLET | Freq: Three times a day (TID) | ORAL | Status: DC | PRN

## 2020-05-16 MED ORDER — CEFAZOLIN SODIUM-DEXTROSE 2-4 GM/100ML-% IV SOLN
2.0000 g | INTRAVENOUS | Status: AC
  Administered 2020-05-16: 2 g via INTRAVENOUS

## 2020-05-16 SURGICAL SUPPLY — 54 items
BAG DECANTER FOR FLEXI CONT (MISCELLANEOUS) ×2 IMPLANT
BAND RUBBER #18 3X1/16 STRL (MISCELLANEOUS) ×4 IMPLANT
BENZOIN TINCTURE PRP APPL 2/3 (GAUZE/BANDAGES/DRESSINGS) ×2 IMPLANT
BLADE CLIPPER SURG (BLADE) IMPLANT
BUR MATCHSTICK NEURO 3.0 LAGG (BURR) ×2 IMPLANT
BUR PRECISION FLUTE 6.0 (BURR) ×2 IMPLANT
CANISTER SUCT 3000ML PPV (MISCELLANEOUS) ×2 IMPLANT
CARTRIDGE OIL MAESTRO DRILL (MISCELLANEOUS) ×1 IMPLANT
COVER WAND RF STERILE (DRAPES) ×2 IMPLANT
DERMABOND ADVANCED (GAUZE/BANDAGES/DRESSINGS) ×1
DERMABOND ADVANCED .7 DNX12 (GAUZE/BANDAGES/DRESSINGS) ×1 IMPLANT
DIFFUSER DRILL AIR PNEUMATIC (MISCELLANEOUS) ×2 IMPLANT
DRAPE LAPAROTOMY 100X72X124 (DRAPES) ×2 IMPLANT
DRAPE MICROSCOPE LEICA (MISCELLANEOUS) ×2 IMPLANT
DRAPE SURG 17X23 STRL (DRAPES) ×8 IMPLANT
DRSG OPSITE POSTOP 4X6 (GAUZE/BANDAGES/DRESSINGS) ×2 IMPLANT
ELECT BLADE 4.0 EZ CLEAN MEGAD (MISCELLANEOUS) ×2
ELECT REM PT RETURN 9FT ADLT (ELECTROSURGICAL) ×2
ELECTRODE BLDE 4.0 EZ CLN MEGD (MISCELLANEOUS) ×1 IMPLANT
ELECTRODE REM PT RTRN 9FT ADLT (ELECTROSURGICAL) ×1 IMPLANT
GAUZE 4X4 16PLY RFD (DISPOSABLE) IMPLANT
GAUZE SPONGE 4X4 12PLY STRL (GAUZE/BANDAGES/DRESSINGS) ×2 IMPLANT
GLOVE BIO SURGEON STRL SZ8 (GLOVE) ×2 IMPLANT
GLOVE BIO SURGEON STRL SZ8.5 (GLOVE) ×2 IMPLANT
GLOVE BIOGEL PI IND STRL 6.5 (GLOVE) ×1 IMPLANT
GLOVE BIOGEL PI IND STRL 7.5 (GLOVE) ×1 IMPLANT
GLOVE BIOGEL PI INDICATOR 6.5 (GLOVE) ×1
GLOVE BIOGEL PI INDICATOR 7.5 (GLOVE) ×1
GLOVE ECLIPSE 7.5 STRL STRAW (GLOVE) ×4 IMPLANT
GLOVE EXAM NITRILE XL STR (GLOVE) IMPLANT
GLOVE SURG SS PI 6.5 STRL IVOR (GLOVE) ×2 IMPLANT
GOWN STRL REUS W/ TWL LRG LVL3 (GOWN DISPOSABLE) IMPLANT
GOWN STRL REUS W/ TWL XL LVL3 (GOWN DISPOSABLE) ×1 IMPLANT
GOWN STRL REUS W/TWL 2XL LVL3 (GOWN DISPOSABLE) IMPLANT
GOWN STRL REUS W/TWL LRG LVL3 (GOWN DISPOSABLE)
GOWN STRL REUS W/TWL XL LVL3 (GOWN DISPOSABLE) ×1
HEMOSTAT POWDER SURGIFOAM 1G (HEMOSTASIS) ×2 IMPLANT
KIT BASIN OR (CUSTOM PROCEDURE TRAY) ×2 IMPLANT
KIT TURNOVER KIT B (KITS) ×2 IMPLANT
NEEDLE HYPO 21X1.5 SAFETY (NEEDLE) IMPLANT
NEEDLE HYPO 22GX1.5 SAFETY (NEEDLE) ×2 IMPLANT
NS IRRIG 1000ML POUR BTL (IV SOLUTION) ×2 IMPLANT
OIL CARTRIDGE MAESTRO DRILL (MISCELLANEOUS) ×2
PACK LAMINECTOMY NEURO (CUSTOM PROCEDURE TRAY) ×2 IMPLANT
PAD ARMBOARD 7.5X6 YLW CONV (MISCELLANEOUS) ×6 IMPLANT
PATTIES SURGICAL .5 X1 (DISPOSABLE) IMPLANT
SPONGE SURGIFOAM ABS GEL SZ50 (HEMOSTASIS) IMPLANT
STRIP CLOSURE SKIN 1/2X4 (GAUZE/BANDAGES/DRESSINGS) ×2 IMPLANT
SUT VIC AB 1 CT1 18XBRD ANBCTR (SUTURE) ×1 IMPLANT
SUT VIC AB 1 CT1 8-18 (SUTURE) ×1
SUT VIC AB 2-0 CP2 18 (SUTURE) ×2 IMPLANT
TOWEL GREEN STERILE (TOWEL DISPOSABLE) ×2 IMPLANT
TOWEL GREEN STERILE FF (TOWEL DISPOSABLE) ×2 IMPLANT
WATER STERILE IRR 1000ML POUR (IV SOLUTION) ×2 IMPLANT

## 2020-05-16 NOTE — Transfer of Care (Signed)
Immediate Anesthesia Transfer of Care Note  Patient: Hannah Moody  Procedure(s) Performed: LAMINECTOMY FOR FACET/SYNOVIAL CYST RIGHT LUMBAR 5- SACRAL 1 (Right )  Patient Location: PACU  Anesthesia Type:General  Level of Consciousness: drowsy and patient cooperative  Airway & Oxygen Therapy: Patient Spontanous Breathing  Post-op Assessment: Report given to RN and Post -op Vital signs reviewed and stable  Post vital signs: Reviewed and stable  Last Vitals:  Vitals Value Taken Time  BP 143/57 05/16/20 1227  Temp 36.5 C 05/16/20 1227  Pulse 106 05/16/20 1240  Resp 24 05/16/20 1240  SpO2 100 % 05/16/20 1240  Vitals shown include unvalidated device data.  Last Pain:  Vitals:   05/16/20 1227  TempSrc:   PainSc: 0-No pain         Complications: No complications documented.

## 2020-05-16 NOTE — Progress Notes (Signed)
   Providing Compassionate, Quality Care - Together   Subjective: Patient reports severe anxiety. She denies pain, numbness, tingling, or weakness.  Objective: Vital signs in last 24 hours: Temp:  [97.5 F (36.4 C)-97.9 F (36.6 C)] 97.5 F (36.4 C) (08/25 1559) Pulse Rate:  [67-106] 106 (08/25 1559) Resp:  [12-39] 20 (08/25 1559) BP: (136-177)/(57-95) 177/84 (08/25 1559) SpO2:  [98 %-100 %] 100 % (08/25 1559) Weight:  [52.6 kg] 52.6 kg (08/25 0806)  Intake/Output from previous day: No intake/output data recorded. Intake/Output this shift: Total I/O In: 1000 [I.V.:1000] Out: 50 [Blood:50]  Alert and oriented x 4, Anxious PERRLA CN II-XII grossly intact MAE, Strength and sensation intact Incision is covered with Honeycomb dressing and Steri Strips; Dressing is clean, dry, and intact   Lab Results: Recent Labs    05/15/20 1409  WBC 7.0  HGB 10.4*  HCT 32.7*  PLT 283   BMET Recent Labs    05/15/20 1409  NA 136  K 4.3  CL 102  CO2 25  GLUCOSE 105*  BUN 26*  CREATININE 1.45*  CALCIUM 9.3    Studies/Results: DG Lumbar Spine 1 View  Result Date: 05/16/2020 CLINICAL DATA:  Laminectomy for synovial cyst excision L5-S1 RIGHT EXAM: LUMBAR SPINE - 1 VIEW COMPARISON:  Portable cross-table lateral view 1105 hours without priors for comparison FINDINGS: Exam presumptively labeled with 5 lumbar vertebra. Correlation of this numbering system with that used on any prior outside imaging recommended for consistency. Curved metallic probe via dorsal approach projects dorsal to the L5-S1 disc space level. Tissue spreaders and surgical sponge project dorsal to L5-S1 disc space and superior S1. Disc space narrowing L4-L5. IMPRESSION: Dorsal intraoperative localization of the L5-S1 disc space level as above. Electronically Signed   By: Ulyses Southward M.D.   On: 05/16/2020 12:15    Assessment/Plan: Patient underwent a right L5-S1 hemilaminectomy for resection of a synovial cyst. She  is presently in the PACU, but extremely anxious about discharging home.    LOS: 0 days    -Diazepam ordered -Patient feels more comfortable staying overnight for observation   Val Eagle, DNP, AGNP-C Nurse Practitioner  [facility] [address], [address], [place], [place] [address] P: [phone]    F: [phone]  05/16/2020, 2:24 PM

## 2020-05-16 NOTE — Anesthesia Postprocedure Evaluation (Signed)
Anesthesia Post Note  Patient: Hannah Moody  Procedure(s) Performed: LAMINECTOMY FOR FACET/SYNOVIAL CYST RIGHT LUMBAR 5- SACRAL 1 (Right )     Patient location during evaluation: PACU Anesthesia Type: General Level of consciousness: awake and alert Pain management: pain level controlled Vital Signs Assessment: post-procedure vital signs reviewed and stable Respiratory status: spontaneous breathing, nonlabored ventilation, respiratory function stable and patient connected to nasal cannula oxygen Cardiovascular status: blood pressure returned to baseline and stable Postop Assessment: no apparent nausea or vomiting Anesthetic complications: no   No complications documented.  Last Vitals:  Vitals:   05/16/20 1345 05/16/20 1400  BP: (!) 150/74 (!) 167/76  Pulse: 89 76  Resp: (!) 22 (!) 21  Temp:    SpO2: 100% 100%    Last Pain:  Vitals:   05/16/20 1345  TempSrc:   PainSc: 3                  Kennieth Rad

## 2020-05-16 NOTE — H&P (Signed)
Subjective: The patient is an 86 year old white female who has complained of back, right buttock and right leg pain consistent with a lumbar radiculopathy.  She has failed medical management was worked up with a lumbar MRI which demonstrated a synovial cyst at L5-S1 on the right.  I discussed the various treatment options with her.  She has decided to proceed with surgery.  Past Medical History:  Diagnosis Date  . Cardiac microvascular disease   . Chronic kidney disease   . Complication of anesthesia    Vomited for three days after hysterectomy  . Hypothyroidism   . PONV (postoperative nausea and vomiting)   . Secondary hyperparathyroidism of renal origin [facility])     Past Surgical History:  Procedure Laterality Date  . ABDOMINAL HYSTERECTOMY    . CARDIAC CATHETERIZATION     Reportedly in 1980's in [place] with no blockages  . CHOLECYSTECTOMY    . EYE SURGERY Right 1999   macular hole repair  . TONSILLECTOMY      Allergies  Allergen Reactions  . Lisinopril Swelling    Social History   Tobacco Use  . Smoking status: Former Smoker    Quit date: 1976    Years since quitting: 45.6  . Smokeless tobacco: Never Used  Substance Use Topics  . Alcohol use: Not Currently    History reviewed. No pertinent family history. Prior to Admission medications   Medication Sig Start Date End Date Taking? Authorizing Provider  amLODipine (NORVASC) 2.5 MG tablet Take 2.5 mg by mouth daily. 04/09/20  Yes [provider]  aspirin EC 81 MG tablet Take 81 mg by mouth daily. Swallow whole.   Yes [provider]  Biotin 1000 MCG tablet Take 1,000 mcg by mouth daily.   Yes [provider]  calcitRIOL (ROCALTROL) 0.25 MCG capsule Take 0.25 mcg by mouth every other day. 02/08/20  Yes [provider]  Cholecalciferol (VITAMIN D) 50 MCG (2000 UT) tablet Take 2,000 Units by mouth daily.   Yes [provider]  gabapentin (NEURONTIN) 300 MG capsule Take 300 mg by  mouth 2 (two) times daily. 04/16/20  Yes [provider]  HYDROcodone-acetaminophen (NORCO/VICODIN) 5-325 MG tablet Take 1-2 tablets by mouth every 6 (six) hours as needed for pain. 04/16/20  Yes [provider]  hydrOXYzine (ATARAX/VISTARIL) 25 MG tablet Take 25 mg by mouth 2 (two) times daily as needed for anxiety. 05/07/20  Yes [provider]  levothyroxine (SYNTHROID) 75 MCG tablet Take 75 mcg by mouth daily before breakfast. 03/17/20  Yes [provider]  vitamin B-12 (CYANOCOBALAMIN) 1000 MCG tablet Take 1,000 mcg by mouth daily.   Yes [provider]     Review of Systems  Positive ROS: As above  All other systems have been reviewed and were otherwise negative with the exception of those mentioned in the HPI and as above.  Objective: Vital signs in last 24 hours: Temp:  [97.6 F (36.4 C)] 97.6 F (36.4 C) (08/25 0806) Pulse Rate:  [78-95] 95 (08/25 0806) Resp:  [18] 18 (08/25 0806) BP: (161-176)/(71-74) 176/74 (08/25 0806) SpO2:  [100 %] 100 % (08/25 0806) Weight:  [52.6 kg-52.8 kg] 52.6 kg (08/25 0806) Estimated body mass index is 18.17 kg/m as calculated from the following:   Height as of this encounter: 5\' 7"  (1.702 m).   Weight as of this encounter: 52.6 kg.   General Appearance: Alert Head: Normocephalic, without obvious abnormality, atraumatic Eyes: PERRL, conjunctiva/corneas clear, EOM's intact,    Ears:  Normal  Throat: Normal  Neck: Supple, Back: unremarkable Lungs: Clear to auscultation bilaterally, respirations unlabored Heart: Regular rate and rhythm, no murmur, rub or gallop Abdomen: Soft, non-tender Extremities: Extremities normal, atraumatic, no cyanosis or edema Skin: unremarkable  NEUROLOGIC:   Mental status: alert and oriented,Motor Exam - grossly normal Sensory Exam - grossly normal Reflexes:  Coordination - grossly normal Gait - grossly normal Balance - grossly normal Cranial Nerves: I: smell Not  tested  II: visual acuity  OS: Normal  OD: Normal   II: visual fields Full to confrontation  II: pupils Equal, round, reactive to light  III,VII: ptosis None  III,IV,VI: extraocular muscles  Full ROM  V: mastication Normal  V: facial light touch sensation  Normal  V,VII: corneal reflex  Present  VII: facial muscle function - upper  Normal  VII: facial muscle function - lower Normal  VIII: hearing Not tested  IX: soft palate elevation  Normal  IX,X: gag reflex Present  XI: trapezius strength  5/5  XI: sternocleidomastoid strength 5/5  XI: neck flexion strength  5/5  XII: tongue strength  Normal    Data Review Lab Results  Component Value Date   WBC 7.0 05/15/2020   HGB 10.4 (L) 05/15/2020   HCT 32.7 (L) 05/15/2020   MCV 92.1 05/15/2020   PLT 283 05/15/2020   Lab Results  Component Value Date   NA 136 05/15/2020   K 4.3 05/15/2020   CL 102 05/15/2020   CO2 25 05/15/2020   BUN 26 (H) 05/15/2020   CREATININE 1.45 (H) 05/15/2020   GLUCOSE 105 (H) 05/15/2020   Lab Results  Component Value Date   INR 0.9 08/03/2007    Assessment/Plan: Right L5-S1 synovial cyst, lumbar, lumbosacral radiculopathy: I have discussed the situation with the patient.  I have reviewed her imaging studies with her and pointed out the abnormalities.  We have discussed the various treatment options including surgery.  I have described the surgical treatment option of a right L5-S1 laminotomy/laminectomy to remove the synovial cyst.  I have shown her surgical models.  I have given her a surgical pamphlet.  We have discussed the risk, benefits, alternatives, expected postoperative course, and likelihood of achieving our goals with surgery.  I have answered all her questions.  She has decided to proceed with surgery.   Cristi Loron 05/16/2020 9:56 AM

## 2020-05-16 NOTE — Op Note (Signed)
Brief history: The patient is an 86 year old white female who has complained of back and right leg pain consistent with a lumbosacral radiculopathy. She has failed medical management and was worked up with a lumbar MRI which demonstrated spinal stenosis at L4-5 and a synovial cyst at L5-S1. I discussed the various treatment options with her but she has decided to proceed with surgery after weighing the risks, benefits and alternatives.  Preoperative diagnosis: Lumbar spinal stenosis, lumbosacral cervical cyst, lumbosacral radiculopathy, lumbago, lumbar radiculopathy  Postoperative diagnosis: The same  Procedure: Right L5-S1 hemilaminectomy for resection of a synovial cyst using microdissection; right L4-5 laminotomy using micro-dissection  Surgeon: Dr. Delma Officer  Asst.: Dr. Jonita Albee  Anesthesia: Gen. endotracheal  Estimated blood loss: 50 cc  Drains: None  Complications: None  Description of procedure: The patient was brought to the operating room by the anesthesia team. General endotracheal anesthesia was induced. The patient was turned to the prone position on the Wilson frame. The patient's lumbosacral region was then prepared with Betadine scrub and Betadine solution. Sterile drapes were applied.  I then injected the area to be incised with Marcaine with epinephrine solution. I then used a scalpel to make a linear midline incision over the L4-5 and L5-S1 intervertebral disc space. I then used electrocautery to perform a right sided subperiosteal dissection exposing the spinous process and lamina of L4, L5 and the upper sacrum. We obtained intraoperative radiograph to confirm our location. I then inserted the [facility] retractor for exposure.  We then brought the operative microscope into the field. Under its magnification and illumination we completed the microdissection. I used a high-speed drill to perform a laminotomy at L4-5 and L5-S1 on the right. I then used a Kerrison  punches to widen the laminotomy and removed the ligamentum flavum at L5-S1, complete the right L5 hemilaminectomy and widen the laminotomy at L4-5 removing the ligamentum flavum. We then used microdissection to free up the thecal sac and the right L5 and S1 nerve root from the epidural tissue. As expected we encountered a large synovial cyst which we separated from the dura using microdissection and removed using the Kerrison punches. I then used a Kerrison punch to perform a foraminotomy at about the right L5 and S1 nerve root. We inspected the intervertebral disc at L5-S1. There were no herniations.  I then palpated along the ventral surface of the thecal sac and along exit route of the right L5 and S1 nerve root and noted that the neural structures were well decompressed. This completed the decompression.  We then obtained hemostasis using bipolar electrocautery. We irrigated the wound out with bacitracin solution. We then removed the retractor. We then reapproximated the patient's thoracolumbar fascia with interrupted #1 Vicryl suture. We then reapproximated the patient's subcutaneous tissue with interrupted 2-0 Vicryl suture. We then reapproximated patient's skin with Steri-Strips and benzoin. The was then coated with bacitracin ointment. The drapes were removed. The patient was subsequently returned to the supine position where they were extubated by the anesthesia team. The patient was then transported to the postanesthesia care unit in stable condition. All sponge instrument and needle counts were reportedly correct at the end of this case.

## 2020-05-17 ENCOUNTER — Encounter (HOSPITAL_COMMUNITY): Payer: Self-pay | Admitting: Neurosurgery

## 2020-05-17 DIAGNOSIS — M4807 Spinal stenosis, lumbosacral region: Secondary | ICD-10-CM | POA: Diagnosis not present

## 2020-05-17 MED ORDER — DOCUSATE SODIUM 100 MG PO CAPS: 100.0000 mg | ORAL_CAPSULE | Freq: Two times a day (BID) | ORAL | 0 refills | Status: AC

## 2020-05-17 MED ORDER — CYCLOBENZAPRINE HCL 5 MG PO TABS: 5.0000 mg | ORAL_TABLET | Freq: Three times a day (TID) | ORAL | Status: DC | PRN

## 2020-05-17 MED ORDER — HYDROCODONE-ACETAMINOPHEN 5-325 MG PO TABS
1.0000 | ORAL_TABLET | ORAL | 0 refills | Status: AC | PRN
Start: 2020-05-17 — End: ?

## 2020-05-17 MED ORDER — CYCLOBENZAPRINE HCL 5 MG PO TABS: 5.0000 mg | ORAL_TABLET | Freq: Three times a day (TID) | ORAL | 0 refills | Status: AC | PRN

## 2020-05-17 NOTE — Discharge Summary (Signed)
Physician Discharge Summary     Providing Compassionate, Quality Care - Together   Patient ID: Hannah Moody MRN: [id number] DOB/AGE: 84-Nov-1931 84 y.o.  Admit date: 05/16/2020 Discharge date: 05/17/2020  Admission Diagnoses: Synovial cyst of lumbar facet joint  Discharge Diagnoses:  Active Problems:   Synovial cyst of lumbar facet joint   Discharged Condition: good  Hospital Course: Patient underwent a right L5-S1 hemilaminectomy for resection of a synovial cyst by Dr. Lovell Sheehan on 05/16/2020. She was admitted to 3C11 overnight for observation. Her postoperative course has been uncomplicated. She has ambulated in the hall with nursing staff.  She is ambulating independently and without difficulty. She is tolerating a normal diet. She is not having any bowel or bladder dysfunction. Her pain is well-controlled with oral pain medication. She is ready for discharge home.   Consults: None  Significant Diagnostic Studies: radiology: DG Lumbar Spine 1 View  Result Date: 05/16/2020 CLINICAL DATA:  Laminectomy for synovial cyst excision L5-S1 RIGHT EXAM: LUMBAR SPINE - 1 VIEW COMPARISON:  Portable cross-table lateral view 1105 hours without priors for comparison FINDINGS: Exam presumptively labeled with 5 lumbar vertebra. Correlation of this numbering system with that used on any prior outside imaging recommended for consistency. Curved metallic probe via dorsal approach projects dorsal to the L5-S1 disc space level. Tissue spreaders and surgical sponge project dorsal to L5-S1 disc space and superior S1. Disc space narrowing L4-L5. IMPRESSION: Dorsal intraoperative localization of the L5-S1 disc space level as above. Electronically Signed   By: Ulyses Southward M.D.   On: 05/16/2020 12:15     Treatments: surgery: Right L5-S1 hemilaminectomy for resection of a synovial cyst using microdissection; right L4-5 laminotomy using micro-dissection  Discharge Exam: Blood pressure (!) 115/45, pulse 79,  temperature 98 F (36.7 C), resp. rate 18, height 5\' 7"  (1.702 m), weight 52.6 kg, SpO2 100 %.   Dr. assessed the patient. Per report: Alert and oriented x 4 PERRLA CN II-XII grossly intact MAE, Strength and sensation intact Incision is covered with Honeycomb dressing and Steri Strips; Dressing is clean, dry, and intact   Disposition: Discharge disposition: 01-Home or Self Care       Discharge Instructions    Call MD for:  difficulty breathing, headache or visual disturbances   Complete by: As directed    Call MD for:  extreme fatigue   Complete by: As directed    Call MD for:  hives   Complete by: As directed    Call MD for:  persistant dizziness or light-headedness   Complete by: As directed    Call MD for:  persistant nausea and vomiting   Complete by: As directed    Call MD for:  redness, tenderness, or signs of infection (pain, swelling, redness, odor or green/yellow discharge around incision site)   Complete by: As directed    Call MD for:  severe uncontrolled pain   Complete by: As directed    Call MD for:  temperature >100.4   Complete by: As directed    Diet - low sodium heart healthy   Complete by: As directed    Discharge instructions   Complete by: As directed    Call [phone] for a followup appointment. Take a stool softener while you are using pain medications.   Driving Restrictions   Complete by: As directed    Do not drive for 2 weeks.   Increase activity slowly   Complete by: As directed    Lifting restrictions  Complete by: As directed    Do not lift more than 5 pounds. No excessive bending or twisting.   May shower / Bathe   Complete by: As directed    Remove the dressing for 3 days after surgery.  You may shower, but leave the incision alone.   Remove dressing in 48 hours   Complete by: As directed      Allergies as of 05/17/2020      Reactions   Lisinopril Swelling      Medication List    TAKE these medications    amLODipine 2.5 MG tablet Commonly known as: NORVASC Take 2.5 mg by mouth daily.   aspirin EC 81 MG tablet Take 81 mg by mouth daily. Swallow whole.   Biotin 1000 MCG tablet Take 1,000 mcg by mouth daily.   calcitRIOL 0.25 MCG capsule Commonly known as: ROCALTROL Take 0.25 mcg by mouth every other day.   cyclobenzaprine 5 MG tablet Commonly known as: FLEXERIL Take 1 tablet (5 mg total) by mouth 3 (three) times daily as needed for muscle spasms.   docusate sodium 100 MG capsule Commonly known as: COLACE Take 1 capsule (100 mg total) by mouth 2 (two) times daily.   gabapentin 300 MG capsule Commonly known as: NEURONTIN Take 300 mg by mouth 2 (two) times daily.   HYDROcodone-acetaminophen 5-325 MG tablet Commonly known as: NORCO/VICODIN Take 1-2 tablets by mouth every 4 (four) hours as needed. What changed:   when to take this  reasons to take this   hydrOXYzine 25 MG tablet Commonly known as: ATARAX/VISTARIL Take 25 mg by mouth 2 (two) times daily as needed for anxiety.   levothyroxine 75 MCG tablet Commonly known as: SYNTHROID Take 75 mcg by mouth daily before breakfast.   vitamin B-12 1000 MCG tablet Commonly known as: CYANOCOBALAMIN Take 1,000 mcg by mouth daily.   Vitamin D 50 MCG (2000 UT) tablet Take 2,000 Units by mouth daily.       Follow-up Information    Tressie Stalker, MD. Schedule an appointment as soon as possible for a visit in 2 week(s).   Specialty: Neurosurgery Contact information: [address] [place] [address] [phone]               Signed: Floreen Comber 05/17/2020, 9:13 AM

## 2020-05-17 NOTE — Plan of Care (Signed)
Pt doing well. Pt and family given D/C instructions with verbal understanding. Rx's were e-prescribed per MD order. Pt's incision is clean and dry with no sign of infection. Pt's IV was removed prior to D/C. Pt D/C'd home via wheelchair per MD order. Pt is stable @ D/C and has no other needs at this time. Rema Fendt, RN

## 2020-05-17 NOTE — Discharge Instructions (Signed)

## 2021-04-10 IMAGING — CR DG LUMBAR SPINE 1V
1 series · 1 of 1 positions shown · non-contrast
Comparison: Portable cross-table lateral view 9913 hours without
priors for comparison

CLINICAL DATA: Laminectomy for synovial cyst excision L5-S1 RIGHT

EXAM:
LUMBAR SPINE - 1 VIEW

[xtable lateral]
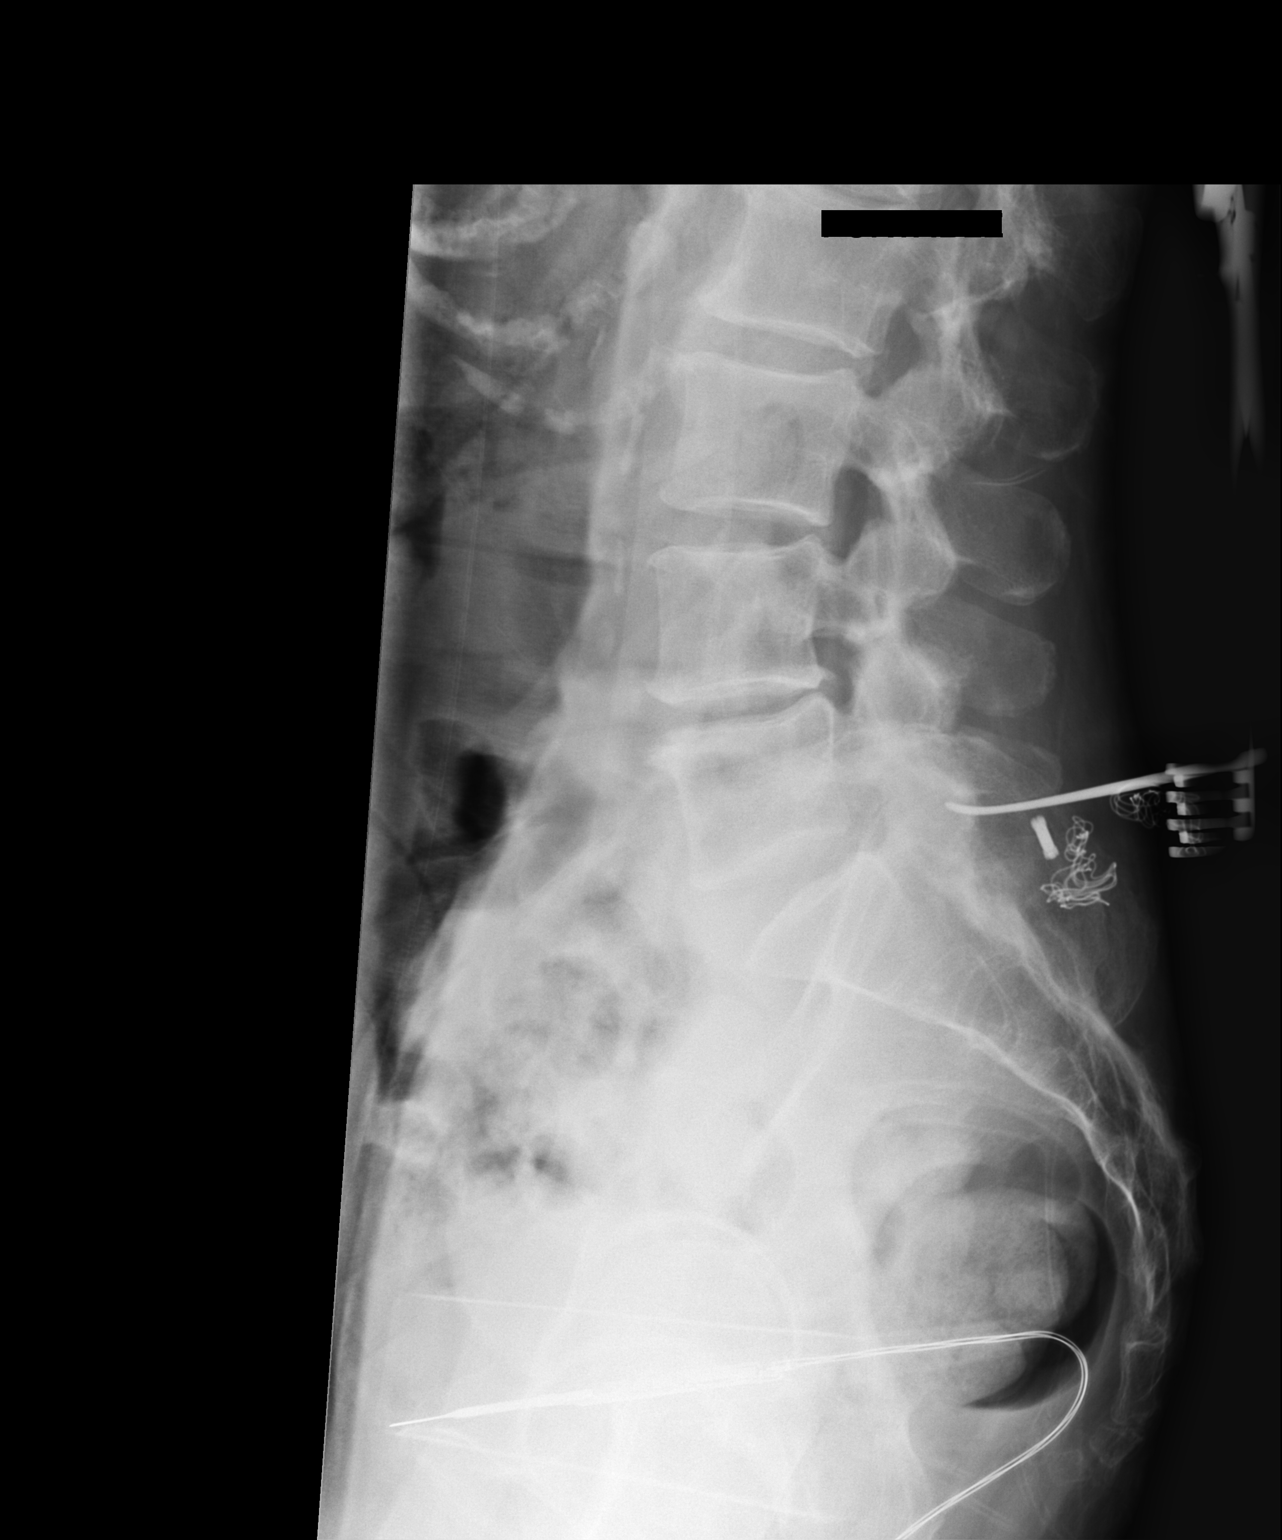

[1 of 1 positions shown; findings below may reference images not displayed]

FINDINGS: Exam presumptively labeled with 5 lumbar vertebra.

Correlation of this numbering system with that used on any prior
outside imaging recommended for consistency.

Curved metallic probe via dorsal approach projects dorsal to the
L5-S1 disc space level.

Tissue spreaders and surgical sponge project dorsal to L5-S1 disc
space and superior S1.

Disc space narrowing L4-L5.
IMPRESSION: Dorsal intraoperative localization of the L5-S1 disc space level as
above.
